# Patient Record
Sex: Female | Born: 1960 | Race: White | Hispanic: No | Smoking: Never smoker
Health system: Southern US, Community
[De-identification: ages and names within clinical notes are randomized; demographics above are authoritative.]

## PROBLEM LIST (undated history)

## (undated) DIAGNOSIS — E785 Hyperlipidemia, unspecified: Secondary | ICD-10-CM

## (undated) DIAGNOSIS — I10 Essential (primary) hypertension: Secondary | ICD-10-CM

## (undated) HISTORY — PX: NO PAST SURGERIES: SHX2092

---

## 2005-07-06 ENCOUNTER — Ambulatory Visit: Payer: Self-pay

## 2006-07-25 ENCOUNTER — Ambulatory Visit: Payer: Self-pay | Admitting: Certified Nurse Midwife

## 2008-01-09 ENCOUNTER — Ambulatory Visit: Payer: Self-pay

## 2008-08-15 ENCOUNTER — Emergency Department: Payer: Self-pay | Admitting: Emergency Medicine

## 2008-08-28 ENCOUNTER — Emergency Department: Payer: Self-pay | Admitting: Emergency Medicine

## 2009-04-15 ENCOUNTER — Ambulatory Visit: Payer: Self-pay

## 2010-06-18 ENCOUNTER — Ambulatory Visit: Payer: Self-pay

## 2010-10-28 ENCOUNTER — Ambulatory Visit: Payer: Self-pay | Admitting: Internal Medicine

## 2011-07-27 ENCOUNTER — Ambulatory Visit: Payer: Self-pay

## 2012-05-07 ENCOUNTER — Emergency Department: Payer: Self-pay | Admitting: Emergency Medicine

## 2012-05-14 ENCOUNTER — Emergency Department: Payer: Self-pay | Admitting: Emergency Medicine

## 2012-07-27 ENCOUNTER — Ambulatory Visit: Payer: Self-pay

## 2013-07-31 ENCOUNTER — Ambulatory Visit: Payer: Self-pay

## 2014-09-12 ENCOUNTER — Ambulatory Visit: Payer: Self-pay

## 2015-07-28 ENCOUNTER — Other Ambulatory Visit: Payer: Self-pay | Admitting: Obstetrics and Gynecology

## 2015-07-28 DIAGNOSIS — Z1231 Encounter for screening mammogram for malignant neoplasm of breast: Secondary | ICD-10-CM

## 2015-09-17 ENCOUNTER — Ambulatory Visit
Admission: RE | Admit: 2015-09-17 | Discharge: 2015-09-17 | Disposition: A | Discharge status: Home / Self Care | Payer: PRIVATE HEALTH INSURANCE | Source: Ambulatory Visit | Attending: Obstetrics and Gynecology | Admitting: Obstetrics and Gynecology

## 2015-09-17 DIAGNOSIS — Z1231 Encounter for screening mammogram for malignant neoplasm of breast: Secondary | ICD-10-CM | POA: Diagnosis not present

## 2016-03-31 ENCOUNTER — Other Ambulatory Visit: Payer: Self-pay | Admitting: Internal Medicine

## 2016-03-31 DIAGNOSIS — E7800 Pure hypercholesterolemia, unspecified: Secondary | ICD-10-CM

## 2016-03-31 DIAGNOSIS — I10 Essential (primary) hypertension: Secondary | ICD-10-CM

## 2016-03-31 DIAGNOSIS — R0789 Other chest pain: Secondary | ICD-10-CM

## 2016-04-08 ENCOUNTER — Ambulatory Visit
Admission: RE | Admit: 2016-04-08 | Discharge: 2016-04-08 | Disposition: A | Discharge status: Home / Self Care | Payer: PRIVATE HEALTH INSURANCE | Source: Ambulatory Visit | Attending: Internal Medicine | Admitting: Internal Medicine

## 2016-04-08 DIAGNOSIS — I10 Essential (primary) hypertension: Secondary | ICD-10-CM | POA: Diagnosis not present

## 2016-04-08 DIAGNOSIS — R0789 Other chest pain: Secondary | ICD-10-CM | POA: Insufficient documentation

## 2016-04-08 DIAGNOSIS — E7800 Pure hypercholesterolemia, unspecified: Secondary | ICD-10-CM | POA: Insufficient documentation

## 2016-04-08 HISTORY — DX: Essential (primary) hypertension: I10

## 2016-04-08 MED ORDER — IOPAMIDOL (ISOVUE-370) INJECTION 76%
75.0000 mL | Freq: Once | INTRAVENOUS | Status: AC | PRN
  Administered 2016-04-08: 75 mL via INTRAVENOUS

## 2016-07-28 ENCOUNTER — Other Ambulatory Visit: Payer: Self-pay | Admitting: Obstetrics and Gynecology

## 2016-07-28 DIAGNOSIS — Z1231 Encounter for screening mammogram for malignant neoplasm of breast: Secondary | ICD-10-CM

## 2016-09-17 ENCOUNTER — Ambulatory Visit
Admission: RE | Admit: 2016-09-17 | Discharge: 2016-09-17 | Disposition: A | Discharge status: Home / Self Care | Payer: BLUE CROSS/BLUE SHIELD | Source: Ambulatory Visit | Attending: Obstetrics and Gynecology | Admitting: Obstetrics and Gynecology

## 2016-09-17 DIAGNOSIS — Z1231 Encounter for screening mammogram for malignant neoplasm of breast: Secondary | ICD-10-CM | POA: Diagnosis present

## 2017-08-03 ENCOUNTER — Other Ambulatory Visit: Payer: Self-pay | Admitting: Obstetrics and Gynecology

## 2017-08-03 DIAGNOSIS — Z1231 Encounter for screening mammogram for malignant neoplasm of breast: Secondary | ICD-10-CM

## 2017-09-21 ENCOUNTER — Ambulatory Visit
Admission: RE | Admit: 2017-09-21 | Discharge: 2017-09-21 | Disposition: A | Discharge status: Home / Self Care | Payer: BLUE CROSS/BLUE SHIELD | Source: Ambulatory Visit | Attending: Obstetrics and Gynecology | Admitting: Obstetrics and Gynecology

## 2017-09-21 DIAGNOSIS — Z1231 Encounter for screening mammogram for malignant neoplasm of breast: Secondary | ICD-10-CM | POA: Diagnosis present

## 2017-11-04 ENCOUNTER — Other Ambulatory Visit: Payer: Self-pay

## 2017-11-04 ENCOUNTER — Inpatient Hospital Stay
Admission: EM | Admit: 2017-11-04 | Discharge: 2017-11-06 | DRG: 603 | Disposition: A | Discharge status: Home / Self Care | Payer: BLUE CROSS/BLUE SHIELD | Attending: Internal Medicine | Admitting: Internal Medicine

## 2017-11-04 ENCOUNTER — Encounter: Payer: Self-pay | Admitting: Emergency Medicine

## 2017-11-04 DIAGNOSIS — Z8042 Family history of malignant neoplasm of prostate: Secondary | ICD-10-CM

## 2017-11-04 DIAGNOSIS — Z79899 Other long term (current) drug therapy: Secondary | ICD-10-CM | POA: Diagnosis not present

## 2017-11-04 DIAGNOSIS — S61251A Open bite of left index finger without damage to nail, initial encounter: Secondary | ICD-10-CM | POA: Diagnosis present

## 2017-11-04 DIAGNOSIS — I10 Essential (primary) hypertension: Secondary | ICD-10-CM | POA: Diagnosis present

## 2017-11-04 DIAGNOSIS — E785 Hyperlipidemia, unspecified: Secondary | ICD-10-CM | POA: Diagnosis present

## 2017-11-04 DIAGNOSIS — E876 Hypokalemia: Secondary | ICD-10-CM | POA: Diagnosis present

## 2017-11-04 DIAGNOSIS — L03012 Cellulitis of left finger: Secondary | ICD-10-CM | POA: Diagnosis present

## 2017-11-04 DIAGNOSIS — W5501XA Bitten by cat, initial encounter: Secondary | ICD-10-CM | POA: Diagnosis not present

## 2017-11-04 DIAGNOSIS — L03114 Cellulitis of left upper limb: Secondary | ICD-10-CM | POA: Diagnosis present

## 2017-11-04 DIAGNOSIS — L0390 Cellulitis, unspecified: Secondary | ICD-10-CM | POA: Diagnosis present

## 2017-11-04 HISTORY — DX: Hyperlipidemia, unspecified: E78.5

## 2017-11-04 LAB — CBC WITH DIFFERENTIAL/PLATELET
Basophils Absolute: 0 10*3/uL (ref 0–0.1)
Basophils Relative: 0 %
EOS ABS: 0 10*3/uL (ref 0–0.7)
Eosinophils Relative: 0 %
HCT: 40.4 % (ref 35.0–47.0)
HEMOGLOBIN: 13.5 g/dL (ref 12.0–16.0)
Lymphocytes Relative: 26 %
Lymphs Abs: 1.9 10*3/uL (ref 1.0–3.6)
MCH: 29.2 pg (ref 26.0–34.0)
MCHC: 33.3 g/dL (ref 32.0–36.0)
MCV: 87.6 fL (ref 80.0–100.0)
Monocytes Absolute: 0.7 10*3/uL (ref 0.2–0.9)
Monocytes Relative: 10 %
NEUTROS PCT: 64 %
Neutro Abs: 4.8 10*3/uL (ref 1.4–6.5)
Platelets: 180 10*3/uL (ref 150–440)
RBC: 4.62 MIL/uL (ref 3.80–5.20)
RDW: 12.9 % (ref 11.5–14.5)
WBC: 7.5 10*3/uL (ref 3.6–11.0)

## 2017-11-04 LAB — COMPREHENSIVE METABOLIC PANEL
ALBUMIN: 4.6 g/dL (ref 3.5–5.0)
ALT: 20 U/L (ref 14–54)
AST: 24 U/L (ref 15–41)
Alkaline Phosphatase: 71 U/L (ref 38–126)
Anion gap: 12 (ref 5–15)
BUN: 10 mg/dL (ref 6–20)
CHLORIDE: 100 mmol/L — AB (ref 101–111)
CO2: 25 mmol/L (ref 22–32)
CREATININE: 0.71 mg/dL (ref 0.44–1.00)
Calcium: 9.3 mg/dL (ref 8.9–10.3)
GFR calc non Af Amer: >60 mL/min (ref >60)
GLUCOSE: 91 mg/dL (ref 65–99)
Potassium: 2.8 mmol/L — ABNORMAL LOW (ref 3.5–5.1)
SODIUM: 137 mmol/L (ref 135–145)
Total Bilirubin: 1.1 mg/dL (ref 0.3–1.2)
Total Protein: 7.9 g/dL (ref 6.5–8.1)

## 2017-11-04 MED ORDER — POTASSIUM CHLORIDE CRYS ER 20 MEQ PO TBCR: 40.0000 meq | EXTENDED_RELEASE_TABLET | Freq: Once | ORAL | Status: DC

## 2017-11-04 MED ORDER — OXYCODONE HCL 5 MG PO TABS: 5.0000 mg | ORAL_TABLET | ORAL | Status: DC | PRN

## 2017-11-04 MED ORDER — VITAMIN B-12 1000 MCG PO TABS
1000.0000 ug | ORAL_TABLET | Freq: Every day | ORAL | Status: DC
  Administered 2017-11-05 – 2017-11-06 (×2): 1000 ug via ORAL
  Filled 2017-11-04 (×2): qty 1

## 2017-11-04 MED ORDER — PIPERACILLIN-TAZOBACTAM 3.375 G IVPB
3.3750 g | Freq: Three times a day (TID) | INTRAVENOUS | Status: DC
  Administered 2017-11-04 – 2017-11-06 (×5): 3.375 g via INTRAVENOUS
  Filled 2017-11-04 (×5): qty 50

## 2017-11-04 MED ORDER — VANCOMYCIN HCL IN DEXTROSE 1-5 GM/200ML-% IV SOLN
INTRAVENOUS | Status: AC
  Administered 2017-11-04: 1000 mg via INTRAVENOUS
  Filled 2017-11-04: qty 200

## 2017-11-04 MED ORDER — PRAVASTATIN SODIUM 20 MG PO TABS
20.0000 mg | ORAL_TABLET | Freq: Every day | ORAL | Status: DC
  Administered 2017-11-05: 20 mg via ORAL
  Filled 2017-11-04: qty 1

## 2017-11-04 MED ORDER — PIPERACILLIN-TAZOBACTAM 3.375 G IVPB
INTRAVENOUS | Status: AC
  Administered 2017-11-04: 3.375 g via INTRAVENOUS
  Filled 2017-11-04: qty 50

## 2017-11-04 MED ORDER — MAGNESIUM SULFATE 2 GM/50ML IV SOLN
2.0000 g | Freq: Once | INTRAVENOUS | Status: AC
Start: 2017-11-04 — End: 2017-11-04
  Administered 2017-11-04: 2 g via INTRAVENOUS
  Filled 2017-11-04: qty 50

## 2017-11-04 MED ORDER — VANCOMYCIN HCL IN DEXTROSE 750-5 MG/150ML-% IV SOLN
750.0000 mg | Freq: Two times a day (BID) | INTRAVENOUS | Status: DC
  Administered 2017-11-05 – 2017-11-06 (×3): 750 mg via INTRAVENOUS
  Filled 2017-11-04 (×5): qty 150

## 2017-11-04 MED ORDER — ACETAMINOPHEN 650 MG RE SUPP
650.0000 mg | Freq: Four times a day (QID) | RECTAL | Status: DC | PRN
  Filled 2017-11-04: qty 1

## 2017-11-04 MED ORDER — PIPERACILLIN-TAZOBACTAM 3.375 G IVPB 30 MIN
3.3750 g | Freq: Once | INTRAVENOUS | Status: AC
  Administered 2017-11-04: 3.375 g via INTRAVENOUS
  Filled 2017-11-04: qty 50

## 2017-11-04 MED ORDER — VANCOMYCIN HCL IN DEXTROSE 1-5 GM/200ML-% IV SOLN
1000.0000 mg | Freq: Once | INTRAVENOUS | Status: AC
  Administered 2017-11-04: 1000 mg via INTRAVENOUS
  Filled 2017-11-04: qty 200

## 2017-11-04 MED ORDER — POTASSIUM CHLORIDE CRYS ER 20 MEQ PO TBCR
40.0000 meq | EXTENDED_RELEASE_TABLET | Freq: Once | ORAL | Status: AC
  Administered 2017-11-04: 40 meq via ORAL
  Filled 2017-11-04: qty 2

## 2017-11-04 MED ORDER — LISINOPRIL 10 MG PO TABS
10.0000 mg | ORAL_TABLET | Freq: Every day | ORAL | Status: DC
  Administered 2017-11-05 – 2017-11-06 (×2): 10 mg via ORAL
  Filled 2017-11-04 (×2): qty 1

## 2017-11-04 MED ORDER — ACETAMINOPHEN 325 MG PO TABS: 650.0000 mg | ORAL_TABLET | Freq: Four times a day (QID) | ORAL | Status: DC | PRN

## 2017-11-04 MED ORDER — CALCIUM CARBONATE-VITAMIN D 500-200 MG-UNIT PO TABS
2.5000 | ORAL_TABLET | Freq: Every day | ORAL | Status: DC
  Administered 2017-11-05 – 2017-11-06 (×2): 2.5 via ORAL
  Filled 2017-11-04: qty 3
  Filled 2017-11-04: qty 2.5
  Filled 2017-11-04: qty 3

## 2017-11-04 NOTE — ED Notes (Signed)
Admission MD at bedside.  

## 2017-11-04 NOTE — Progress Notes (Signed)
Pharmacy Antibiotic Note  Candice FootmanDonna K Radel is a 56 y.o. female admitted on 11/04/2017 with cellulitis/Wound infection.  Pharmacy has been consulted for vancomycin and Zosyn dosing. Patient received Vancomycin 1g IV and Zosyn 3.375 IV x 1 dose in ED.  Patient failed outpatient therapy on Augmentin   Plan: Ke: 0.066   T1/2: 10.5  Vd: 48.6  Will start vancomycin 750mg  IV every 12 hours with 6 hour stack dosing. Calculated trough at Css is 13.6. Trough level ordered prior to 4th dose. WIll monitor renal function and adjust dose as needed.   Start Zosyn 3.375 IV EI every 8 hours.   Height: 5\' 4"  (162.6 cm) Weight: 153 lb (69.4 kg) IBW/kg (Calculated) : 54.7  Temp (24hrs), Avg:98.4 F (36.9 C), Min:98.4 F (36.9 C), Max:98.4 F (36.9 C)  Recent Labs  Lab 11/04/17 1623  WBC 7.5  CREATININE 0.71    Estimated Creatinine Clearance: 74.2 mL/min (by C-G formula based on SCr of 0.71 mg/dL).    No Known Allergies  Antimicrobials this admission: 11/16 Zosyn  >>  11/16 Vancomycin >>   Dose adjustments this admission:  Microbiology results: 11/16  Wound Cx: pending   Thank you for allowing pharmacy to be a part of this patient's care.  Gardner CandleSheema M Wanisha Shiroma, PharmD, BCPS Clinical Pharmacist 11/04/2017 6:52 PM

## 2017-11-04 NOTE — H&P (Signed)
[facility] -  at [facility]   PATIENT NAME: Candice Stewart    MR#:  [id number]  DATE OF BIRTH:  11/03/1961  DATE OF ADMISSION:  11/04/2017  PRIMARY CARE PHYSICIAN: Candice ReichmannHande, Vishwanath, MD   REQUESTING/REFERRING PHYSICIAN: Dr Candice RaddleJohnathan Stewart  CHIEF COMPLAINT:   Chief Complaint  Patient presents with  . Animal Bite    HISTORY OF PRESENT ILLNESS:  Candice Stewart  is a 56 y.o. female was bringing her To the vent after she accidentally hit the cat with a car.  Her cat was upset and bit her on the finger.  The veterinarian cleaned out her finger yesterday and gave her some Augmentin.  Today her finger is red and swollen and started having red streaking up the arm.  Some discomfort in the area but she cannot bend her finger where the bite was.  Hospitalist services were contacted for further evaluation.  PAST MEDICAL HISTORY:   Past Medical History:  Diagnosis Date  . Hyperlipidemia   . Hypertension     PAST SURGICAL HISTORY:   Past Surgical History:  Procedure Laterality Date  . NO PAST SURGERIES      SOCIAL HISTORY:   Social History   Tobacco Use  . Smoking status: Never Smoker  . Smokeless tobacco: Never Used  Substance Use Topics  . Alcohol use: No    Frequency: Never    FAMILY HISTORY:   Family History  Problem Relation Age of Onset  . CVA Mother   . Prostate cancer Father   . Breast cancer Neg Hx     DRUG ALLERGIES:  No Known Allergies  REVIEW OF SYSTEMS:  CONSTITUTIONAL: No fever, fatigue or weakness.  EYES: No blurred or double vision.  EARS, NOSE, AND THROAT: No tinnitus or ear pain. No sore throat RESPIRATORY: No cough, shortness of breath, wheezing or hemoptysis.  CARDIOVASCULAR: No chest pain, orthopnea, edema.  GASTROINTESTINAL: Positive nausea with Augmentin. No vomiting, diarrhea or abdominal pain. No blood in bowel movements GENITOURINARY: No dysuria, hematuria.  ENDOCRINE: No polyuria, nocturia,  HEMATOLOGY:  No anemia, easy bruising or bleeding SKIN: No rash or lesion. MUSCULOSKELETAL: No joint pain or arthritis.   NEUROLOGIC: No tingling, numbness, weakness.  PSYCHIATRY: No anxiety or depression.   MEDICATIONS AT HOME:   Prior to Admission medications   Medication Sig Start Date End Date Taking? Authorizing Provider  amoxicillin-clavulanate (AUGMENTIN) 875-125 MG tablet Take 1 tablet 2 (two) times daily by mouth. 11/03/17 11/12/17 Yes [provider]  Calcium Carbonate-Vitamin D (CALCIUM HIGH POTENCY/VITAMIN D) 600-200 MG-UNIT TABS Take 2.5 tablets daily by mouth.   Yes [provider]  lisinopril-hydrochlorothiazide (PRINZIDE,ZESTORETIC) 10-12.5 MG tablet Take 1 tablet daily by mouth. 09/11/17  Yes [provider]  lovastatin (MEVACOR) 20 MG tablet Take 40 mg daily by mouth. 09/11/17  Yes [provider]  valACYclovir (VALTREX) 500 MG tablet Take 500 mg 2 (two) times daily as needed by mouth. 07/28/17  Yes [provider]  vitamin B-12 (CYANOCOBALAMIN) 1000 MCG tablet Take 1,000 mcg daily by mouth.   Yes [provider]      VITAL SIGNS:  Blood pressure 131/86, pulse 89, temperature 98.4 F (36.9 C), temperature source Oral, resp. rate 14, height 5\' 4"  (1.626 m), weight 69.4 kg (153 lb), SpO2 97 %.  PHYSICAL EXAMINATION:  GENERAL:  56 y.o.-year-old patient lying in the bed with no acute distress.  EYES: Pupils equal, round, reactive to light and accommodation. No scleral icterus. Extraocular muscles intact.  HEENT: Head atraumatic, normocephalic. Oropharynx and nasopharynx clear.  NECK:  Supple, no jugular venous distention. No thyroid enlargement, no tenderness.  LUNGS: Normal breath sounds bilaterally, no wheezing, rales,rhonchi or crepitation. No use of accessory muscles of respiration.  CARDIOVASCULAR: S1, S2 normal. No murmurs, rubs, or gallops.  ABDOMEN: Soft, nontender, nondistended. Bowel sounds present. No organomegaly or mass.   EXTREMITIES: No pedal edema, cyanosis, or clubbing.  NEUROLOGIC: Cranial nerves II through XII are intact. Muscle strength 5/5 in all extremities. Sensation intact. Gait not checked.  PSYCHIATRIC: The patient is alert and oriented x 3.  SKIN: Left hand index finger bite between the PIP and DIP joint.  Surrounding erythema on the finger up into the hand and streaking up the arm.  The streaking has faded a little bit since IV antibiotics were given in the ER.  Patient unable to bend her finger completely.  LABORATORY PANEL:   CBC Recent Labs  Lab 11/04/17 1623  WBC 7.5  HGB 13.5  HCT 40.4  PLT 180   ------------------------------------------------------------------------------------------------------------------  Chemistries  Recent Labs  Lab 11/04/17 1623  NA 137  K 2.8*  CL 100*  CO2 25  GLUCOSE 91  BUN 10  CREATININE 0.71  CALCIUM 9.3  AST 24  ALT 20  ALKPHOS 71  BILITOT 1.1   ------------------------------------------------------------------------------------------------------------------   IMPRESSION AND PLAN:   1.  Cat bite cellulitis that has worsened quickly.  IV antibiotics with Zosyn and vancomycin.  Culture sent by ER physician.  Case discussed with orthopedic Dr. Joice LoftsPoggi who would like to evaluate the finger prior to admission. 2.  Hypokalemia.  Replace magnesium and potassium.  Likely cause of this is hydrochlorothiazide.  Discontinue the hydrochlorothiazide out of her combination pill. 3.  Essential hypertension continue lisinopril only 4.  Hyperlipidemia unspecified on statin.  All the records are reviewed and case discussed with ED provider. Management plans discussed with the patient, family and they are in agreement.  CODE STATUS: Full code  TOTAL TIME TAKING CARE OF THIS PATIENT: 50 minutes.    Candice Stewart M.D on 11/04/2017 at 6:26 PM  Between 7am to 6pm - Pager - [phone]  After 6pm call admission pager [phone]  [facility]  [phone]  CC: Primary care physician; Candice ReichmannHande, Vishwanath, MD

## 2017-11-04 NOTE — ED Triage Notes (Signed)
Says bit by cat yesterday.  Today has red area on left hand.  Says it is her cat and is up to date on shots.  Sent here by kc for iv antibiotics.

## 2017-11-04 NOTE — Consult Note (Signed)
ORTHOPAEDIC CONSULTATION  REQUESTING PHYSICIAN: Alford HighlandWieting, Richard, MD  Chief Complaint:   Left index finger pain and swelling.  History of Present Illness: Candice FootmanDonna K Curtice is a 56 y.o. female with a history of hypertension hyperlipidemia who was bitten by her cat 2 days ago after she accidentally struck the cat with her car.  She went to the veterinarian yesterday who washed out her finger and gave her a prescription for some Augmentin.  Over the past 12 hours or so, she has noted increased pain and swelling in the finger.  [facility] and was sent over to the emergency room for admission and IV antibiotic treatment.  The patient denies any fevers denies any numbness or paresthesias to her fingertip.  She is able to flex and extend the index finger, although motion is somewhat limited due to her swelling.  Past Medical History:  Diagnosis Date  . Hyperlipidemia   . Hypertension    Past Surgical History:  Procedure Laterality Date  . NO PAST SURGERIES     Social History   Socioeconomic History  . Marital status: Widowed    Spouse name: None  . Number of children: None  . Years of education: None  . Highest education level: None  Social Needs  . Financial resource strain: None  . Food insecurity - worry: None  . Food insecurity - inability: None  . Transportation needs - medical: None  . Transportation needs - non-medical: None  Occupational History  . None  Tobacco Use  . Smoking status: Never Smoker  . Smokeless tobacco: Never Used  Substance and Sexual Activity  . Alcohol use: No    Frequency: Never  . Drug use: None  . Sexual activity: None  Other Topics Concern  . None  Social History Narrative  . None   Family History  Problem Relation Age of Onset  . CVA Mother   . Prostate cancer Father   . Breast cancer Neg Hx    No Known Allergies Prior to Admission medications    Medication Sig Start Date End Date Taking? Authorizing Provider  amoxicillin-clavulanate (AUGMENTIN) 875-125 MG tablet Take 1 tablet 2 (two) times daily by mouth. 11/03/17 11/12/17 Yes [provider]  Calcium Carbonate-Vitamin D (CALCIUM HIGH POTENCY/VITAMIN D) 600-200 MG-UNIT TABS Take 2.5 tablets daily by mouth.   Yes [provider]  lisinopril-hydrochlorothiazide (PRINZIDE,ZESTORETIC) 10-12.5 MG tablet Take 1 tablet daily by mouth. 09/11/17  Yes [provider]  lovastatin (MEVACOR) 20 MG tablet Take 40 mg daily by mouth. 09/11/17  Yes [provider]  valACYclovir (VALTREX) 500 MG tablet Take 500 mg 2 (two) times daily as needed by mouth. 07/28/17  Yes [provider]  vitamin B-12 (CYANOCOBALAMIN) 1000 MCG tablet Take 1,000 mcg daily by mouth.   Yes [provider]   No results found.  Positive ROS: All other systems have been reviewed and were otherwise negative with the exception of those mentioned in the HPI and as above.  Physical Exam: General:  Alert, no acute distress Psychiatric:  Patient is competent for consent with normal mood and affect   Cardiovascular:  No pedal edema Respiratory:  No wheezing, non-labored breathing GI:  Abdomen is soft and non-tender Skin:  No lesions in the area of chief complaint Neurologic:  Sensation intact distally Lymphatic:  No axillary or cervical lymphadenopathy  Orthopedic Exam:  Orthopedic examination is limited to the left hand.  The left index finger demonstrates moderate swelling and erythema  radial half of the next finger centered over the middle phalanx, but extending to the DIP and PIP joints.  She has 2 punctate sites along the radial aspect of the middle phalanx, one dorsal and one volar.  A trace amount of purulent material can be expressed from the more dorsal bite.  She has no significant tenderness to palpation along the flexor sheath and is able to flex and extend both the PIP and  DIP joints, although motion is somewhat limited due to her swelling.  She has neurovascularly intact to her fingertip.  X-rays:  None  Assessment: Left index finger cellulitis secondary to cat bite.  Plan: I agree with the proposed plan for admitting this patient for close observation and IV antibiotics.  At this point, she does not appear to have a flexor tenosynovitis, but we would need to watch the finger carefully in case this develops.  Thank you for asked me to participate in the care of this most pleasant woman.  I will be happy to follow her with you.   Maryagnes AmosJ. Jeffrey Joshia Kitchings, MD  Beeper #:  [phone]  11/04/2017 6:51 PM

## 2017-11-04 NOTE — ED Notes (Signed)
Hospitalist to bedside at this time 

## 2017-11-04 NOTE — ED Provider Notes (Signed)
[facility] Emergency Department Provider Note       Time seen: ----------------------------------------- 4:32 PM on 11/04/2017 -----------------------------------------    I have reviewed the triage vital signs and the nursing notes.  HISTORY   Chief Complaint Animal Bite    HPI Candice FootmanDonna K Stewart is a 56 y.o. female with a history of hypertension who presents to the ED for a cat bite and possible infection in her left index finger.  She was bitten by her cat yesterday, today has a red area on the left hand.  She reports the shots for the cat or up-to-date.  She has started on Augmentin yesterday but notes the hand was not as swollen as it is today.  She is not having much pain but has had marketed redness and swelling of the left index finger and left hand.  Past Medical History:  Diagnosis Date  . Hypertension     There are no active problems to display for this patient.   History reviewed. No pertinent surgical history.  Allergies Patient has no known allergies.  Social History Social History   Tobacco Use  . Smoking status: Never Smoker  . Smokeless tobacco: Never Used  Substance Use Topics  . Alcohol use: No    Frequency: Never  . Drug use: Not on file    Review of Systems Constitutional: Negative for fever. Cardiovascular: Negative for chest pain. Respiratory: Negative for shortness of breath. Gastrointestinal: Negative for abdominal pain, vomiting and diarrhea. Genitourinary: Negative for dysuria. Musculoskeletal: Positive for left index finger pain Skin: Positive for left hand erythema Neurological: Negative for headaches, focal weakness or numbness.  All systems negative/normal/unremarkable except as stated in the HPI  ____________________________________________   PHYSICAL EXAM:  VITAL SIGNS: ED Triage Vitals  Enc Vitals Group     BP 11/04/17 1554 131/86     Pulse Rate 11/04/17 1554 89     Resp 11/04/17 1554 14   Temp 11/04/17 1554 98.4 F (36.9 C)     Temp Source 11/04/17 1554 Oral     SpO2 11/04/17 1554 97 %     Weight 11/04/17 1556 153 lb (69.4 kg)     Height 11/04/17 1556 5\' 4"  (1.626 m)     Head Circumference --      Peak Flow --      Pain Score --      Pain Loc --      Pain Edu? --      Excl. in GC? --    Constitutional: Alert and oriented. Well appearing and in no distress. Eyes: Conjunctivae are normal. Normal extraocular movements. Cardiovascular: Normal rate, regular rhythm. No murmurs, rubs, or gallops. Respiratory: Normal respiratory effort without tachypnea nor retractions. Breath sounds are clear and equal bilaterally. No wheezes/rales/rhonchi. Musculoskeletal: Some tenderness and purulent drainage is noted from the left index finger.  Mild pain with range of motion of the left index finger Neurologic:  Normal speech and language. No gross focal neurologic deficits are appreciated.  Skin: 2 puncture wounds are noted over the distal aspect of the left index finger.  There is diffuse mild swelling of the left index finger and erythema that extends into the hand dorsally.  Some erythema is noted in the wrist Psychiatric: Mood and affect are normal. Speech and behavior are normal.  ____________________________________________  ED COURSE:  Pertinent labs & imaging results that were available during my care of the patient were reviewed by me and considered in my medical decision making (see  chart for details). Patient presents for a cat bite, we will assess with labs as indicated.  She will require IV antibiotics.   Procedures ____________________________________________   LABS (pertinent positives/negatives)  Labs Reviewed  COMPREHENSIVE METABOLIC PANEL  CBC WITH DIFFERENTIAL/PLATELET   ____________________________________________  DIFFERENTIAL DIAGNOSIS   Cat bite, cellulitis, tenosynovitis, sepsis  FINAL ASSESSMENT AND PLAN  Cellulitis, cat bite   Plan: Patient had  presented for a cat bite and cellulitis that appears to be worsening. Patient's labs are reassuring but the degree of progressive swelling and erythema is concerning.  She does have some purulent drainage which we have sent for wound culture.  I have ordered IV Vanco and Zosyn and will discuss with the hospitalist for observation.   Emily FilbertWilliams, Monroe Qin E, MD   Note: This note was generated in part or whole with voice recognition software. Voice recognition is usually quite accurate but there are transcription errors that can and very often do occur. I apologize for any typographical errors that were not detected and corrected.     Emily FilbertWilliams, Teri Legacy E, MD 11/04/17 [phone]

## 2017-11-04 NOTE — ED Notes (Signed)
Attempted to call report, RN unavailable at this time.  Name and ascom number left to receive call back. 

## 2017-11-05 LAB — CBC
HEMATOCRIT: 36 % (ref 35.0–47.0)
HEMOGLOBIN: 12 g/dL (ref 12.0–16.0)
MCH: 29.2 pg (ref 26.0–34.0)
MCHC: 33.4 g/dL (ref 32.0–36.0)
MCV: 87.4 fL (ref 80.0–100.0)
Platelets: 169 10*3/uL (ref 150–440)
RBC: 4.12 MIL/uL (ref 3.80–5.20)
RDW: 12.9 % (ref 11.5–14.5)
WBC: 5.1 10*3/uL (ref 3.6–11.0)

## 2017-11-05 LAB — BASIC METABOLIC PANEL
Anion gap: 8 (ref 5–15)
BUN: 11 mg/dL (ref 6–20)
CO2: 26 mmol/L (ref 22–32)
CREATININE: 0.75 mg/dL (ref 0.44–1.00)
Calcium: 8.6 mg/dL — ABNORMAL LOW (ref 8.9–10.3)
Chloride: 104 mmol/L (ref 101–111)
GFR calc Af Amer: >60 mL/min (ref >60)
GLUCOSE: 108 mg/dL — AB (ref 65–99)
Potassium: 3.9 mmol/L (ref 3.5–5.1)
Sodium: 138 mmol/L (ref 135–145)

## 2017-11-05 LAB — MAGNESIUM: MAGNESIUM: 2.2 mg/dL (ref 1.7–2.4)

## 2017-11-05 NOTE — Progress Notes (Signed)
[employer] - [place] at [facility]                                                                                                                                                                                  Patient Demographics   Candice Stewart, is a 56 y.o. female, DOB - 1961/11/15, HYQ:[id number]  Admit date - 11/04/2017   Admitting Physician Alford Highland, MD  Outpatient Primary MD for the patient is Barbette Reichmann, MD   LOS - 1  Subjective: Patient states that the swelling of the arm is improved left hand index finger able to move more    Review of Systems:   CONSTITUTIONAL: No documented fever. No fatigue, weakness. No weight gain, no weight loss.  EYES: No blurry or double vision.  ENT: No tinnitus. No postnasal drip. No redness of the oropharynx.  RESPIRATORY: No cough, no wheeze, no hemoptysis. No dyspnea.  CARDIOVASCULAR: No chest pain. No orthopnea. No palpitations. No syncope.  GASTROINTESTINAL: No nausea, no vomiting or diarrhea. No abdominal pain. No melena or hematochezia.  GENITOURINARY: No dysuria or hematuria.  ENDOCRINE: No polyuria or nocturia. No heat or cold intolerance.  HEMATOLOGY: No anemia. No bruising. No bleeding.  INTEGUMENTARY: No rashes. No lesions.  MUSCULOSKELETAL: No arthritis. No swelling. No gout.  Left hand index finger swollen NEUROLOGIC: No numbness, tingling, or ataxia. No seizure-type activity.  PSYCHIATRIC: No anxiety. No insomnia. No ADD.    Vitals:   Vitals:   11/04/17 1556 11/04/17 2000 11/05/17 0505 11/05/17 0931  BP:  109/70 101/64 119/73  Pulse:  84 74 88  Resp:  18 18 16   Temp:  97.7 F (36.5 C) 98.5 F (36.9 C)   TempSrc:  Oral Oral   SpO2:  100% 99% 98%  Weight: 153 lb (69.4 kg)     Height: 5\' 4"  (1.626 m)       Wt Readings from Last 3 Encounters:  11/04/17 153 lb (69.4 kg)     Intake/Output Summary (Last 24 hours) at 11/05/2017 1313 Last data filed at 11/05/2017 0910 Gross per 24 hour   Intake 450 ml  Output 1 ml  Net 449 ml    Physical Exam:   GENERAL: Pleasant-appearing in no apparent distress.  HEAD, EYES, EARS, NOSE AND THROAT: Atraumatic, normocephalic. Extraocular muscles are intact. Pupils equal and reactive to light. Sclerae anicteric. No conjunctival injection. No oro-pharyngeal erythema.  NECK: Supple. There is no jugular venous distention. No bruits, no lymphadenopathy, no thyromegaly.  HEART: Regular rate and rhythm,. No murmurs, no rubs, no clicks.  LUNGS: Clear to auscultation bilaterally. No rales or rhonchi. No wheezes.  ABDOMEN: Soft, flat, nontender, nondistended. Has good  bowel sounds. No hepatosplenomegaly appreciated.  EXTREMITIES: No evidence of any cyanosis, clubbing, or peripheral edema.  +2 pedal and radial pulses bilaterally.  Left hand index finger erythema and swelling NEUROLOGIC: The patient is alert, awake, and oriented x3 with no focal motor or sensory deficits appreciated bilaterally.  SKIN: Moist and warm with no rashes appreciated.  Psych: Not anxious, depressed LN: No inguinal LN enlargement    Antibiotics   Anti-infectives (From admission, onward)   Start     Dose/Rate Route Frequency Ordered Stop   11/05/17 0000  vancomycin (VANCOCIN) IVPB 750 mg/150 ml premix     750 mg 150 mL/hr over 60 Minutes Intravenous Every 12 hours 11/04/17 1848     11/04/17 2200  piperacillin-tazobactam (ZOSYN) IVPB 3.375 g     3.375 g 12.5 mL/hr over 240 Minutes Intravenous Every 8 hours 11/04/17 1848     11/04/17 1700  vancomycin (VANCOCIN) IVPB 1000 mg/200 mL premix     1,000 mg 200 mL/hr over 60 Minutes Intravenous  Once 11/04/17 1655 11/04/17 1817   11/04/17 1700  piperacillin-tazobactam (ZOSYN) IVPB 3.375 g     3.375 g 100 mL/hr over 30 Minutes Intravenous  Once 11/04/17 1655 11/04/17 1752      Medications   Scheduled Meds: . calcium-vitamin D  2.5 tablet Oral Daily  . lisinopril  10 mg Oral Daily  . potassium chloride  40 mEq Oral  Once  . pravastatin  20 mg Oral q1800  . vitamin B-12  1,000 mcg Oral Daily   Continuous Infusions: . piperacillin-tazobactam (ZOSYN)  IV Stopped (11/05/17 0910)  . vancomycin Stopped (11/05/17 0114)   PRN Meds:.acetaminophen **OR** acetaminophen, oxyCODONE   Data Review:   Micro Results Recent Results (from the past 240 hour(s))  Aerobic/Anaerobic Culture (surgical/deep wound)     Status: None (Preliminary result)   Collection Time: 11/04/17  5:11 PM  Result Value Ref Range Status   Specimen Description WOUND  Final   Special Requests Normal  Final   Gram Stain   Final    RARE WBC PRESENT, PREDOMINANTLY PMN NO ORGANISMS SEEN Performed at [facility], [address] [place], [place] [address]    Culture PENDING  Incomplete   Report Status PENDING  Incomplete    Radiology Reports No results found.   CBC Recent Labs  Lab 11/04/17 1623 11/05/17 0323  WBC 7.5 5.1  HGB 13.5 12.0  HCT 40.4 36.0  PLT 180 169  MCV 87.6 87.4  MCH 29.2 29.2  MCHC 33.3 33.4  RDW 12.9 12.9  LYMPHSABS 1.9  --   MONOABS 0.7  --   EOSABS 0.0  --   BASOSABS 0.0  --     Chemistries  Recent Labs  Lab 11/04/17 1623 11/05/17 0323  NA 137 138  K 2.8* 3.9  CL 100* 104  CO2 25 26  GLUCOSE 91 108*  BUN 10 11  CREATININE 0.71 0.75  CALCIUM 9.3 8.6*  MG  --  2.2  AST 24  --   ALT 20  --   ALKPHOS 71  --   BILITOT 1.1  --    ------------------------------------------------------------------------------------------------------------------ estimated creatinine clearance is 74.2 mL/min (by C-G formula based on SCr of 0.75 mg/dL). ------------------------------------------------------------------------------------------------------------------ No results for input(s): HGBA1C in the last 72 hours. ------------------------------------------------------------------------------------------------------------------ No results for input(s): CHOL, HDL, LDLCALC, TRIG, CHOLHDL, LDLDIRECT in  the last 72 hours. ------------------------------------------------------------------------------------------------------------------ No results for input(s): TSH, T4TOTAL, T3FREE, THYROIDAB in the last 72 hours.  Invalid input(s):  FREET3 ------------------------------------------------------------------------------------------------------------------ No results for input(s): VITAMINB12, FOLATE, FERRITIN, TIBC, IRON, RETICCTPCT in the last 72 hours.  Coagulation profile No results for input(s): INR, PROTIME in the last 168 hours.  No results for input(s): DDIMER in the last 72 hours.  Cardiac Enzymes No results for input(s): CKMB, TROPONINI, MYOGLOBIN in the last 168 hours.  Invalid input(s): CK ------------------------------------------------------------------------------------------------------------------ Invalid input(s): POCBNP    Assessment & Plan   1.  Cat bite cellulitis continue IV antibiotics.  Can change to oral tomorrow 2.  Hypokalemia.    Replaced 3.  Essential hypertension continue lisinopril  4.  Hyperlipidemia unspecified on statin.       Code Status Orders  (From admission, onward)        Start     Ordered   11/04/17 1820  Full code  Continuous     11/04/17 1819    Code Status History    Date Active Date Inactive Code Status Order ID Comments User Context   This patient has a current code status but no historical code status.           Consults orthopedics  DVT Prophylaxis ambulatory Lab Results  Component Value Date   PLT 169 11/05/2017     Time Spent in minutes   35min Greater than 50% of time spent in care coordination and counseling patient regarding the condition and plan of care.   Auburn BilberryPATEL, Maricruz Lucero M.D on 11/05/2017 at 1:13 PM  Between 7am to 6pm - Pager - [phone]  After 6pm go to [email or web address] - password [email or web address] [facility]  [facility]   Office  [phone]

## 2017-11-05 NOTE — Plan of Care (Signed)
  Progressing Clinical Measurements: Ability to avoid or minimize complications of infection will improve 11/05/2017 1626 - Progressing by Garwin Brothershomas, Preslyn Warr Lynn, RN Note Redness and edema decreasing on pt's left arm, hand and index finger Skin Integrity: Skin integrity will improve 11/05/2017 1626 - Progressing by Garwin Brothershomas, Sanaia Jasso Lynn, RN Education: Knowledge of General Education information will improve 11/05/2017 1626 - Progressing by Garwin Brothershomas, Nathanal Hermiz Lynn, RN Health Behavior/Discharge Planning: Ability to manage health-related needs will improve 11/05/2017 1626 - Progressing by Garwin Brothershomas, Emiah Pellicano Lynn, RN Clinical Measurements: Ability to maintain clinical measurements within normal limits will improve 11/05/2017 1626 - Progressing by Garwin Brothershomas, Lexus Shampine Lynn, RN Will remain free from infection 11/05/2017 1626 - Progressing by Garwin Brothershomas, Damante Spragg Lynn, RN Diagnostic test results will improve 11/05/2017 1626 - Progressing by Garwin Brothershomas, Mansfield Dann Lynn, RN Respiratory complications will improve 11/05/2017 1626 - Progressing by Garwin Brothershomas, Susano Cleckler Lynn, RN Cardiovascular complication will be avoided 11/05/2017 1626 - Progressing by Garwin Brothershomas, Abdelaziz Westenberger Lynn, RN Activity: Risk for activity intolerance will decrease 11/05/2017 1626 - Progressing by Garwin Brothershomas, Elpidia Karn Lynn, RN Nutrition: Adequate nutrition will be maintained 11/05/2017 1626 - Progressing by Garwin Brothershomas, Caley Ciaramitaro Lynn, RN Coping: Level of anxiety will decrease 11/05/2017 1626 - Progressing by Garwin Brothershomas, Khira Cudmore Lynn, RN Elimination: Will not experience complications related to bowel motility 11/05/2017 1626 - Progressing by Garwin Brothershomas, Johneisha Broaden Lynn, RN Will not experience complications related to urinary retention 11/05/2017 1626 - Progressing by Garwin Brothershomas, Deshanta Lady Lynn, RN Pain Managment: General experience of comfort will improve 11/05/2017 1626 - Progressing by Garwin Brothershomas, Hollis Tuller Lynn, RN Safety: Ability to remain free from injury will improve 11/05/2017 1626 - Progressing by Garwin Brothershomas,  Elizah Lydon Lynn, RN Note Pt remains on Low Fall Risks Skin Integrity: Risk for impaired skin integrity will decrease 11/05/2017 1626 - Progressing by Garwin Brothershomas, Karema Tocci Lynn, RN

## 2017-11-05 NOTE — Progress Notes (Signed)
Pt remaining alert and oriented. No complaints of pain to the left hand. Left hand still swollen and red. Tolerating iv antibiotics without difficulty.

## 2017-11-05 NOTE — Progress Notes (Signed)
Subjective: The patient already notes improvement in her symptoms since last night.  She feels that the swelling and erythema have improved.  She denies any new complaints.   Objective: Vital signs in last 24 hours: Temp:  [97.7 F (36.5 C)-98.5 F (36.9 C)] 98.5 F (36.9 C) (11/17 0505) Pulse Rate:  [74-89] 74 (11/17 0505) Resp:  [14-18] 18 (11/17 0505) BP: (101-131)/(64-86) 101/64 (11/17 0505) SpO2:  [97 %-100 %] 99 % (11/17 0505) Weight:  [69.4 kg (153 lb)] 69.4 kg (153 lb) (11/16 1556)  Intake/Output from previous day: 11/16 0701 - 11/17 0700 In: 400 [IV Piggyback:400] Out: 1 [Urine:1] Intake/Output this shift: No intake/output data recorded.  Recent Labs    11/04/17 1623 11/05/17 0323  HGB 13.5 12.0   Recent Labs    11/04/17 1623 11/05/17 0323  WBC 7.5 5.1  RBC 4.62 4.12  HCT 40.4 36.0  PLT 180 169   Recent Labs    11/04/17 1623 11/05/17 0323  NA 137 138  K 2.8* 3.9  CL 100* 104  CO2 25 26  BUN 10 11  CREATININE 0.71 0.75  GLUCOSE 91 108*  CALCIUM 9.3 8.6*   No results for input(s): LABPT, INR in the last 72 hours.  Physical Exam: Examination of the left index finger and upper extremity demonstrates the erythema is much improved.  The areas of erythema extending up into her forearm have resolved.  She still has some erythema involving the index finger centered over the bite sites but not extending beyond the mid-portion of the proximal phalanx.  Moderate swelling of the finger persists, but she is able to flex and extend her finger freely and without pain, although she is still stiff due to the swelling.  She remains neurovascular intact to all digits.  Assessment: Cellulitis of left upper extremity secondary to cat bite to left index finger, improving symptomatically.  Plan: The patient already appears to be responding to the IV antibiotics.  I feel that it would be reasonable and appropriate to continue IV antibiotics for another 24 hours before  switching her over to p.o. antibiotics.   Excell SeltzerJohn J Jonessa Triplett 11/05/2017, 8:17 AM

## 2017-11-06 ENCOUNTER — Other Ambulatory Visit: Payer: Self-pay

## 2017-11-06 LAB — CBC
HCT: 36.4 % (ref 35.0–47.0)
Hemoglobin: 12.1 g/dL (ref 12.0–16.0)
MCH: 29.2 pg (ref 26.0–34.0)
MCHC: 33.2 g/dL (ref 32.0–36.0)
MCV: 87.9 fL (ref 80.0–100.0)
PLATELETS: 170 10*3/uL (ref 150–440)
RBC: 4.15 MIL/uL (ref 3.80–5.20)
RDW: 13 % (ref 11.5–14.5)
WBC: 4.9 10*3/uL (ref 3.6–11.0)

## 2017-11-06 LAB — BASIC METABOLIC PANEL
ANION GAP: 7 (ref 5–15)
BUN: 14 mg/dL (ref 6–20)
CHLORIDE: 106 mmol/L (ref 101–111)
CO2: 25 mmol/L (ref 22–32)
Calcium: 8.9 mg/dL (ref 8.9–10.3)
Creatinine, Ser: 0.82 mg/dL (ref 0.44–1.00)
GFR calc Af Amer: >60 mL/min (ref >60)
GLUCOSE: 95 mg/dL (ref 65–99)
POTASSIUM: 4 mmol/L (ref 3.5–5.1)
Sodium: 138 mmol/L (ref 135–145)

## 2017-11-06 LAB — HIV ANTIBODY (ROUTINE TESTING W REFLEX): HIV Screen 4th Generation wRfx: NONREACTIVE

## 2017-11-06 NOTE — Progress Notes (Signed)
Subjective: Patient feeling much better today.  Notes that most of the stiffness and soreness in her finger has resolved.  Objective: Vital signs in last 24 hours: Temp:  [98.5 F (36.9 C)-99 F (37.2 C)] 98.5 F (36.9 C) (11/18 0952) Pulse Rate:  [73-86] 86 (11/18 0952) Resp:  [14] 14 (11/18 0952) BP: (109-126)/(68-86) 126/86 (11/18 0952) SpO2:  [97 %-100 %] 100 % (11/18 0952)  Intake/Output from previous day: 11/17 0701 - 11/18 0700 In: 708.5 [P.O.:470; IV Piggyback:238.5] Out: -  Intake/Output this shift: Total I/O In: 340 [P.O.:240; IV Piggyback:100] Out: -   Recent Labs    11/04/17 1623 11/05/17 0323 11/06/17 0330  HGB 13.5 12.0 12.1   Recent Labs    11/05/17 0323 11/06/17 0330  WBC 5.1 4.9  RBC 4.12 4.15  HCT 36.0 36.4  PLT 169 170   Recent Labs    11/05/17 0323 11/06/17 0330  NA 138 138  K 3.9 4.0  CL 104 106  CO2 26 25  BUN 11 14  CREATININE 0.75 0.82  GLUCOSE 108* 95  CALCIUM 8.6* 8.9   No results for input(s): LABPT, INR in the last 72 hours.  Physical Exam: There is only mild residual erythema and swelling around the 2 bite sites in the midportion of her left index middle phalanx.  All of the other swelling and erythema has completely resolved.  She exhibits near full range of motion of the index finger actively and passively.  She is neurovascularly intact to the fingertip.  Assessment: Left index finger cellulitis status post cat bite, nearly resolved symptomatically.  Plan: I feel that the patient can be discharged home today with p.o. antibiotics.  She may progress in her activities as symptoms permit.  She will follow-up with me in 1 week if necessary.  Thank you for asking me to participate in the care of this most pleasant woman.   Excell SeltzerJohn J Klara Stjames 11/06/2017, 12:21 PM

## 2017-11-06 NOTE — Discharge Summary (Signed)
[employer] [facility] - [place] at [facility]  Candice Stewart, 56 y.o., DOB 10-09-1961, MRN [id number]. Admission date: 11/04/2017 Discharge Date 11/06/2017 Primary MD Barbette ReichmannHande, Vishwanath, MD Admitting Physician Alford Highlandichard Wieting, MD  Admission Diagnosis  Cellulitis of finger of left hand [L03.012] Cat bite, initial encounter [W55.01XA]  Discharge Diagnosis   Active Problems: Left hand index finger cellulitis due to cat bite Hypokalemia Essential hypertension Hyperlipidemia      Hospital Course Candice Stewart  is a 56 y.o. female was bringing her To the vent after she accidentally hit the cat with a car.  Her cat was upset and bit her on the finger.  The veterinarian cleaned out her finger  and gave her some Augmentin.  On day of her admission her finger is red and swollen and started having red streaking up the arm.  Due to this patient was admitted to the hospital.  She was started on IV antibiotics and was seen by orthopedic surgery.  They did not recommend that she needs surgery at this point.  Swelling of the hand is much improved.  Patient will be now transition back to oral antibiotics.              Consults  orthopedic surgery  Significant Tests:  See full reports for all details     No results found.     Today   Subjective:   Candice Stewart patient's finger is much better swelling much less Objective:   Blood pressure 126/86, pulse 86, temperature 98.5 F (36.9 C), temperature source Oral, resp. rate 14, height 5\' 4"  (1.626 m), weight 153 lb (69.4 kg), SpO2 100 %.  .  Intake/Output Summary (Last 24 hours) at 11/06/2017 1603 Last data filed at 11/06/2017 1037 Gross per 24 hour  Intake 620.05 ml  Output -  Net 620.05 ml    Exam VITAL SIGNS: Blood pressure 126/86, pulse 86, temperature 98.5 F (36.9 C), temperature source Oral, resp. rate 14, height 5\' 4"  (1.626 m), weight 153 lb (69.4 kg), SpO2 100 %.  GENERAL:  56 y.o.-year-old patient lying in the bed  with no acute distress.  EYES: Pupils equal, round, reactive to light and accommodation. No scleral icterus. Extraocular muscles intact.  HEENT: Head atraumatic, normocephalic. Oropharynx and nasopharynx clear.  NECK:  Supple, no jugular venous distention. No thyroid enlargement, no tenderness.  LUNGS: Normal breath sounds bilaterally, no wheezing, rales,rhonchi or crepitation. No use of accessory muscles of respiration.  CARDIOVASCULAR: S1, S2 normal. No murmurs, rubs, or gallops.  ABDOMEN: Soft, nontender, nondistended. Bowel sounds present. No organomegaly or mass.  EXTREMITIES: Left index finger with mild erythema but significant improvement NEUROLOGIC: Cranial nerves II through XII are intact. Muscle strength 5/5 in all extremities. Sensation intact. Gait not checked.  PSYCHIATRIC: The patient is alert and oriented x 3.  SKIN: No obvious rash, lesion, or ulcer.   Data Review     CBC w Diff:  Lab Results  Component Value Date   WBC 4.9 11/06/2017   HGB 12.1 11/06/2017   HCT 36.4 11/06/2017   PLT 170 11/06/2017   LYMPHOPCT 26 11/04/2017   MONOPCT 10 11/04/2017   EOSPCT 0 11/04/2017   BASOPCT 0 11/04/2017   CMP:  Lab Results  Component Value Date   NA 138 11/06/2017   K 4.0 11/06/2017   CL 106 11/06/2017   CO2 25 11/06/2017   BUN 14 11/06/2017   CREATININE 0.82 11/06/2017   PROT 7.9 11/04/2017   ALBUMIN 4.6 11/04/2017  BILITOT 1.1 11/04/2017   ALKPHOS 71 11/04/2017   AST 24 11/04/2017   ALT 20 11/04/2017  .  Micro Results Recent Results (from the past 240 hour(s))  Aerobic/Anaerobic Culture (surgical/deep wound)     Status: None (Preliminary result)   Collection Time: 11/04/17  5:11 PM  Result Value Ref Range Status   Specimen Description WOUND  Final   Special Requests Normal  Final   Gram Stain   Final    RARE WBC PRESENT, PREDOMINANTLY PMN NO ORGANISMS SEEN    Culture   Final    CULTURE REINCUBATED FOR BETTER GROWTH Performed at [facility],  [address] [place], [place] [address]    Report Status PENDING  Incomplete     Code Status History    Date Active Date Inactive Code Status Order ID Comments User Context   11/04/2017 18:19 11/06/2017 16:00 Full Code [id number]  Alford HighlandWieting, Richard, MD ED          Follow-up Information    Barbette ReichmannHande, Vishwanath, MD Follow up.   Specialty:  Internal Medicine Contact information: [address] [facility] [place] [address] [phone]           Discharge Medications   Allergies as of 11/06/2017   No Known Allergies     Medication List    TAKE these medications   amoxicillin-clavulanate 875-125 MG tablet Commonly known as:  AUGMENTIN Take 1 tablet 2 (two) times daily by mouth.   CALCIUM HIGH POTENCY/VITAMIN D 600-200 MG-UNIT Tabs Generic drug:  Calcium Carbonate-Vitamin D Take 2.5 tablets daily by mouth.   lisinopril-hydrochlorothiazide 10-12.5 MG tablet Commonly known as:  PRINZIDE,ZESTORETIC Take 1 tablet daily by mouth.   lovastatin 20 MG tablet Commonly known as:  MEVACOR Take 40 mg daily by mouth.   valACYclovir 500 MG tablet Commonly known as:  VALTREX Take 500 mg 2 (two) times daily as needed by mouth.   vitamin B-12 1000 MCG tablet Commonly known as:  CYANOCOBALAMIN Take 1,000 mcg daily by mouth.          Total Time in preparing paper work, data evaluation and todays exam - 35 minutes  Auburn BilberryPATEL, Nishika Parkhurst M.D on 11/06/2017 at 4:03 [facility]  [facility]   Office  [phone]

## 2017-11-06 NOTE — Progress Notes (Signed)
Patient is to be discharged home today. Patient is in no acute distress at this time, and assessment is unchanged from this morning. Patient's IV is out, discharge paperwork has been discussed with patient/family and there are no questions or concerns at this time. Patient will be accompanied downstairs by staff and family via wheelchair.   

## 2017-11-06 NOTE — Discharge Instructions (Signed)
[employer] - [place] at [facility] ° °DIET:  °Cardiac diet ° °DISCHARGE CONDITION:  °Stable ° °ACTIVITY:  °Activity as tolerated ° °OXYGEN:  °Home Oxygen: No. °  °Oxygen Delivery: room air ° °DISCHARGE LOCATION:  °home  ° ° °ADDITIONAL DISCHARGE INSTRUCTION: ° ° °If you experience worsening of your admission symptoms, develop shortness of breath, life threatening emergency, suicidal or homicidal thoughts you must seek medical attention immediately by calling 911 or calling your MD immediately  if symptoms less severe. ° °You Must read complete instructions/literature along with all the possible adverse reactions/side effects for all the Medicines you take and that have been prescribed to you. Take any new Medicines after you have completely understood and accpet all the possible adverse reactions/side effects.  ° °Please note ° °You were cared for by a hospitalist during your hospital stay. If you have any questions about your discharge medications or the care you received while you were in the hospital after you are discharged, you can call the unit and asked to speak with the hospitalist on call if the hospitalist that took care of you is not available. Once you are discharged, your primary care physician will handle any further medical issues. Please note that NO REFILLS for any discharge medications will be authorized once you are discharged, as it is imperative that you return to your primary care physician (or establish a relationship with a primary care physician if you do not have one) for your aftercare needs so that they can reassess your need for medications and monitor your lab values. ° ° °

## 2017-11-06 NOTE — Progress Notes (Signed)
Pt redness and swelling decreasing. No complaints of pain this shift. Up to bathroom independently. Pt able to sleep in between care.

## 2017-11-06 NOTE — Progress Notes (Signed)
Per Dr. Allena KatzPatel 1130 vancomycin trough can be discontinued and 1200 dose of vancomycin can be held and pt may be discharged on PO antibiotics.

## 2017-11-10 LAB — AEROBIC/ANAEROBIC CULTURE (SURGICAL/DEEP WOUND)

## 2017-11-10 LAB — AEROBIC/ANAEROBIC CULTURE W GRAM STAIN (SURGICAL/DEEP WOUND): Special Requests: NORMAL

## 2018-08-09 ENCOUNTER — Other Ambulatory Visit: Payer: Self-pay | Admitting: Obstetrics and Gynecology

## 2018-08-09 DIAGNOSIS — Z1231 Encounter for screening mammogram for malignant neoplasm of breast: Secondary | ICD-10-CM

## 2018-09-25 ENCOUNTER — Ambulatory Visit
Admission: RE | Admit: 2018-09-25 | Discharge: 2018-09-25 | Disposition: A | Discharge status: Home / Self Care | Payer: BLUE CROSS/BLUE SHIELD | Source: Ambulatory Visit | Attending: Obstetrics and Gynecology | Admitting: Obstetrics and Gynecology

## 2018-09-25 DIAGNOSIS — Z1231 Encounter for screening mammogram for malignant neoplasm of breast: Secondary | ICD-10-CM | POA: Insufficient documentation

## 2019-08-06 ENCOUNTER — Other Ambulatory Visit: Payer: Self-pay | Admitting: Internal Medicine

## 2019-08-06 DIAGNOSIS — Z1231 Encounter for screening mammogram for malignant neoplasm of breast: Secondary | ICD-10-CM

## 2019-09-03 ENCOUNTER — Other Ambulatory Visit: Payer: Self-pay | Admitting: Obstetrics and Gynecology

## 2019-09-03 DIAGNOSIS — Z1231 Encounter for screening mammogram for malignant neoplasm of breast: Secondary | ICD-10-CM

## 2019-10-09 ENCOUNTER — Ambulatory Visit
Admission: RE | Admit: 2019-10-09 | Discharge: 2019-10-09 | Disposition: A | Discharge status: Home / Self Care | Payer: PRIVATE HEALTH INSURANCE | Source: Ambulatory Visit | Attending: Obstetrics and Gynecology | Admitting: Obstetrics and Gynecology

## 2019-10-09 DIAGNOSIS — Z1231 Encounter for screening mammogram for malignant neoplasm of breast: Secondary | ICD-10-CM | POA: Insufficient documentation

## 2020-09-29 ENCOUNTER — Other Ambulatory Visit: Payer: Self-pay | Admitting: Internal Medicine

## 2020-09-29 DIAGNOSIS — Z1231 Encounter for screening mammogram for malignant neoplasm of breast: Secondary | ICD-10-CM

## 2020-10-16 ENCOUNTER — Ambulatory Visit
Admission: RE | Admit: 2020-10-16 | Discharge: 2020-10-16 | Disposition: A | Discharge status: Home / Self Care | Payer: 59 | Source: Ambulatory Visit | Attending: Internal Medicine | Admitting: Internal Medicine

## 2020-10-16 ENCOUNTER — Other Ambulatory Visit: Payer: Self-pay

## 2020-10-16 DIAGNOSIS — Z1231 Encounter for screening mammogram for malignant neoplasm of breast: Secondary | ICD-10-CM | POA: Insufficient documentation

## 2021-09-28 ENCOUNTER — Other Ambulatory Visit: Payer: Self-pay | Admitting: Internal Medicine

## 2021-09-28 DIAGNOSIS — Z1231 Encounter for screening mammogram for malignant neoplasm of breast: Secondary | ICD-10-CM

## 2021-11-11 ENCOUNTER — Other Ambulatory Visit: Payer: Self-pay

## 2021-11-11 ENCOUNTER — Ambulatory Visit
Admission: RE | Admit: 2021-11-11 | Discharge: 2021-11-11 | Disposition: A | Discharge status: Home / Self Care | Payer: 59 | Source: Ambulatory Visit | Attending: Internal Medicine | Admitting: Internal Medicine

## 2021-11-11 DIAGNOSIS — Z1231 Encounter for screening mammogram for malignant neoplasm of breast: Secondary | ICD-10-CM | POA: Diagnosis present

## 2021-11-18 ENCOUNTER — Other Ambulatory Visit: Payer: Self-pay | Admitting: Internal Medicine

## 2021-11-18 DIAGNOSIS — R928 Other abnormal and inconclusive findings on diagnostic imaging of breast: Secondary | ICD-10-CM

## 2021-11-18 DIAGNOSIS — N6489 Other specified disorders of breast: Secondary | ICD-10-CM

## 2021-11-27 ENCOUNTER — Other Ambulatory Visit: Payer: Self-pay

## 2021-11-27 ENCOUNTER — Ambulatory Visit
Admission: RE | Admit: 2021-11-27 | Discharge: 2021-11-27 | Disposition: A | Discharge status: Home / Self Care | Payer: 59 | Source: Ambulatory Visit | Attending: Internal Medicine | Admitting: Internal Medicine

## 2021-11-27 DIAGNOSIS — R928 Other abnormal and inconclusive findings on diagnostic imaging of breast: Secondary | ICD-10-CM

## 2021-11-27 DIAGNOSIS — N6489 Other specified disorders of breast: Secondary | ICD-10-CM | POA: Diagnosis present

## 2021-12-23 DIAGNOSIS — Z03818 Encounter for observation for suspected exposure to other biological agents ruled out: Secondary | ICD-10-CM | POA: Diagnosis not present

## 2022-02-08 IMAGING — MG MM DIGITAL DIAGNOSTIC UNILAT*L* W/ TOMO W/ CAD
6 series · 6 of 18 positions shown · non-contrast
Comparison: Previous exam(s).

CLINICAL DATA: Patient recalled from screening for left breast
asymmetry.

EXAM:
DIGITAL DIAGNOSTIC UNILATERAL LEFT MAMMOGRAM WITH TOMOSYNTHESIS AND
CAD
TECHNIQUE: Left digital diagnostic mammography and breast tomosynthesis was
performed. The images were evaluated with computer-aided detection.

[L CC synth-2D]
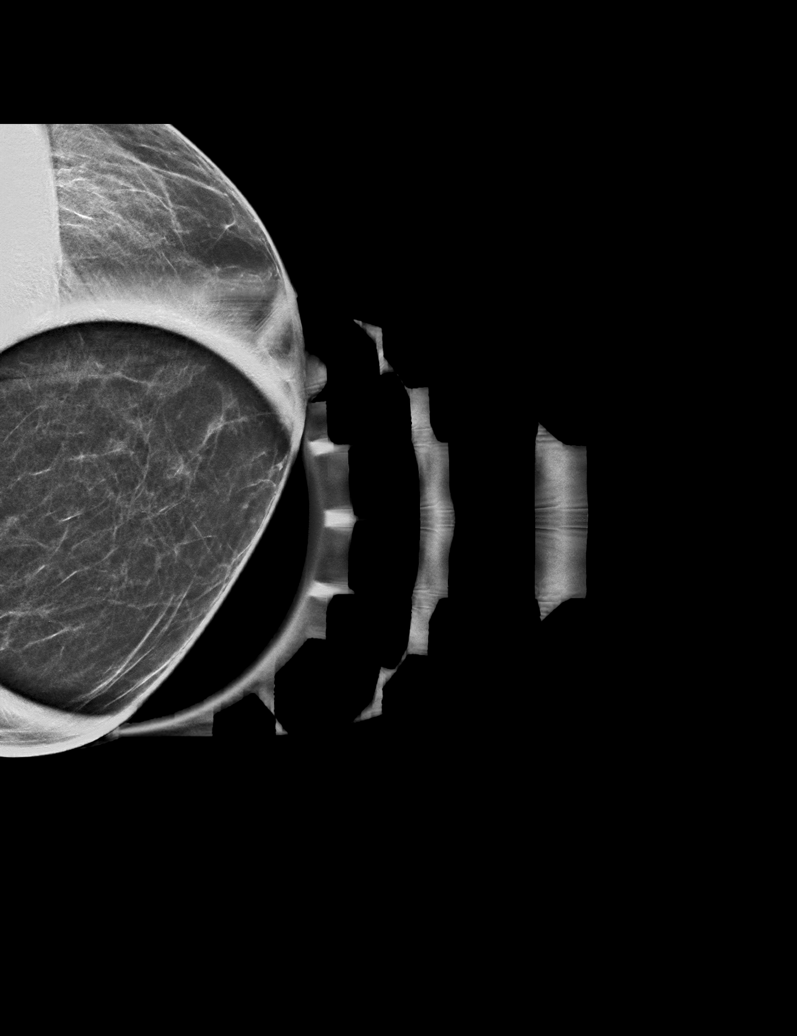

[L ML synth-2D (1 of 2)]
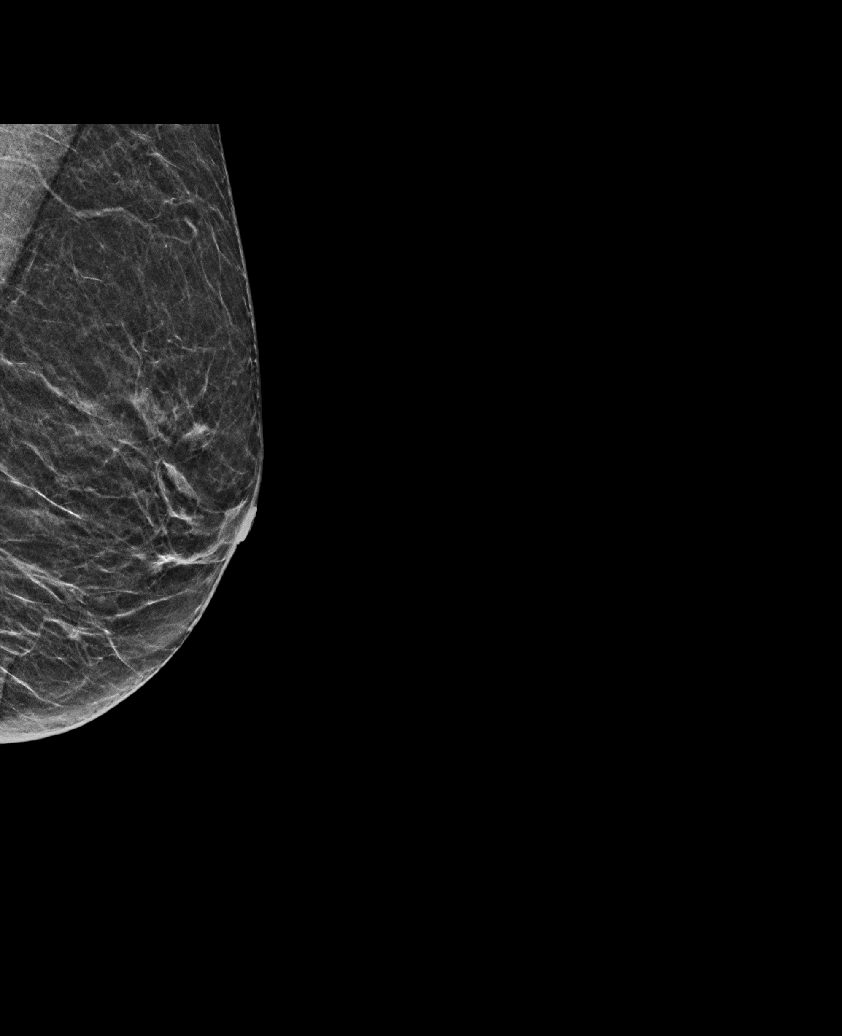

[L ML synth-2D (2 of 2)]
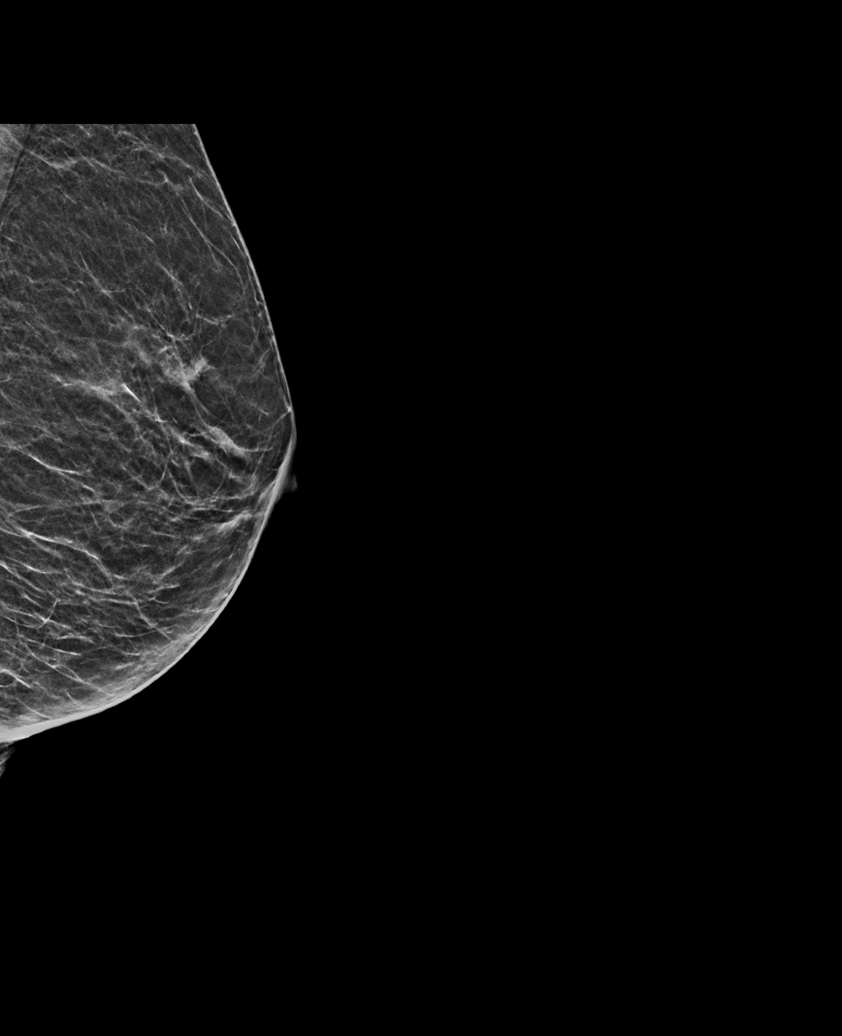

[L ML tomo (1 of 2) · tomo slice 21/41.0]
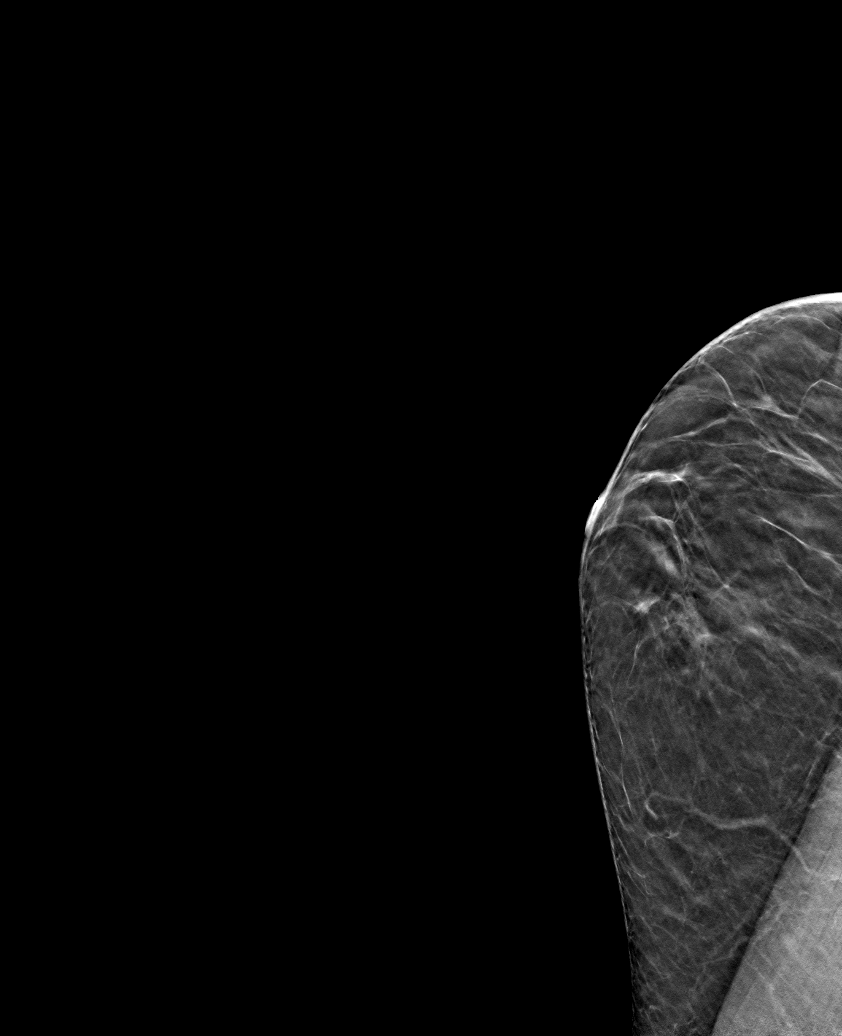

[L ML tomo (2 of 2) · tomo slice 20/39.0]
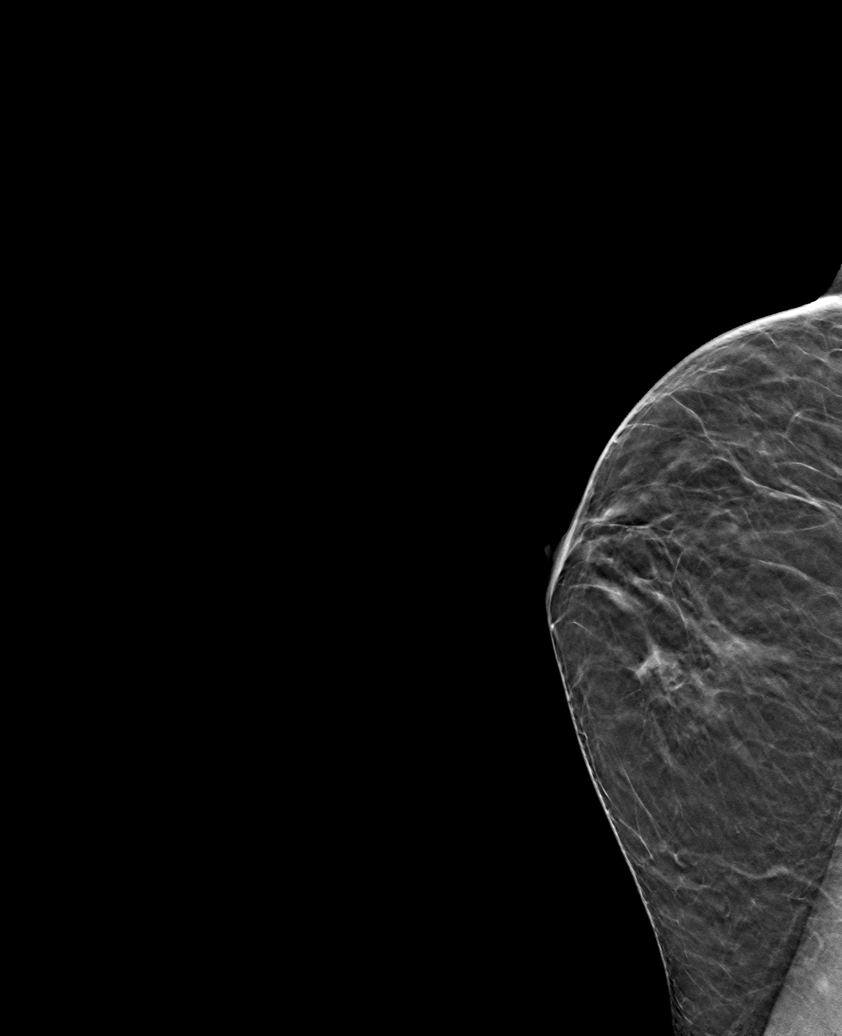

[L CC tomo · tomo slice 20/39.0]
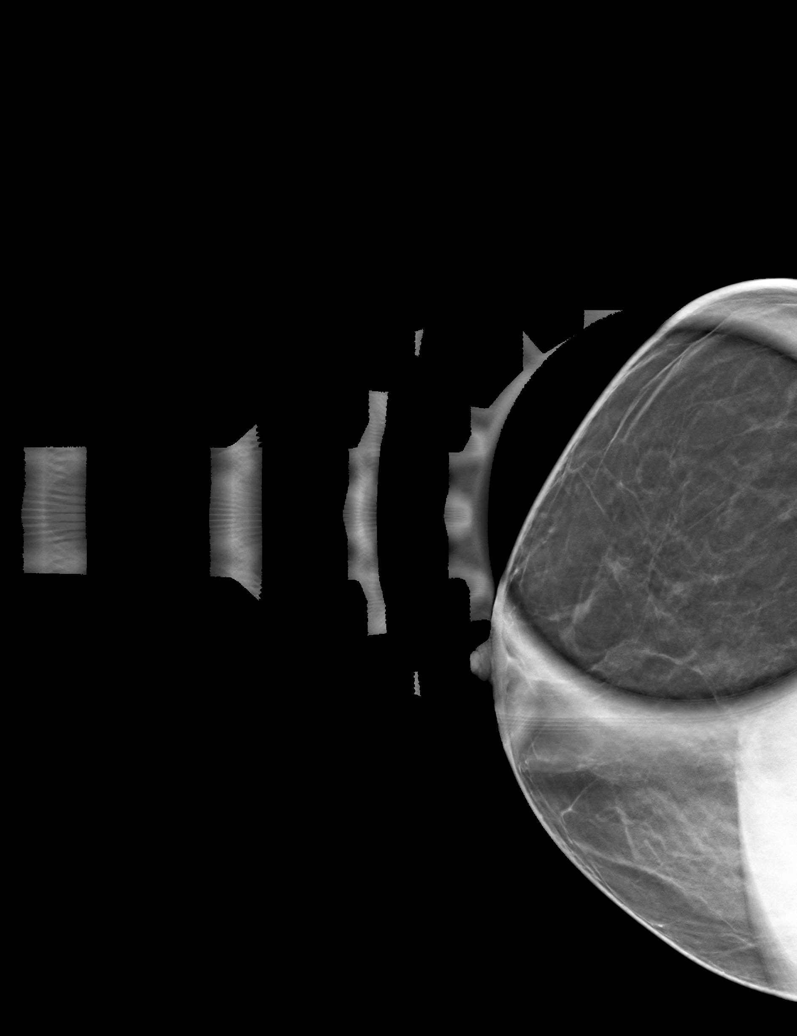

[6 of 18 positions shown; findings below may reference images not displayed]

ACR Breast Density Category b: There are scattered areas of
fibroglandular density.
FINDINGS: Questioned asymmetry within the left breast resolved with additional
imaging compatible with dense fibroglandular tissue. No suspicious
findings.
IMPRESSION: No mammographic evidence for malignancy.

RECOMMENDATION:
Screening mammogram in one year.(Code:[id number])

I have discussed the findings and recommendations with the patient.
If applicable, a reminder letter will be sent to the patient
regarding the next appointment.

BI-RADS CATEGORY  1: Negative.

## 2022-02-09 DIAGNOSIS — I10 Essential (primary) hypertension: Secondary | ICD-10-CM | POA: Diagnosis not present

## 2022-02-09 DIAGNOSIS — M8589 Other specified disorders of bone density and structure, multiple sites: Secondary | ICD-10-CM | POA: Diagnosis not present

## 2022-02-09 DIAGNOSIS — G4763 Sleep related bruxism: Secondary | ICD-10-CM | POA: Diagnosis not present

## 2022-02-09 DIAGNOSIS — E7800 Pure hypercholesterolemia, unspecified: Secondary | ICD-10-CM | POA: Diagnosis not present

## 2022-02-19 DIAGNOSIS — G4763 Sleep related bruxism: Secondary | ICD-10-CM | POA: Diagnosis not present

## 2022-02-19 DIAGNOSIS — E7800 Pure hypercholesterolemia, unspecified: Secondary | ICD-10-CM | POA: Diagnosis not present

## 2022-02-19 DIAGNOSIS — I10 Essential (primary) hypertension: Secondary | ICD-10-CM | POA: Diagnosis not present

## 2022-02-19 DIAGNOSIS — Z1331 Encounter for screening for depression: Secondary | ICD-10-CM | POA: Diagnosis not present

## 2022-02-19 DIAGNOSIS — Z0000 Encounter for general adult medical examination without abnormal findings: Secondary | ICD-10-CM | POA: Diagnosis not present

## 2022-05-02 ENCOUNTER — Other Ambulatory Visit: Payer: Self-pay

## 2022-05-02 ENCOUNTER — Encounter: Payer: Self-pay | Admitting: Emergency Medicine

## 2022-05-02 ENCOUNTER — Emergency Department
Admission: EM | Admit: 2022-05-02 | Discharge: 2022-05-02 | Disposition: A | Discharge status: Home / Self Care | Payer: 59 | Attending: Emergency Medicine | Admitting: Emergency Medicine

## 2022-05-02 DIAGNOSIS — S61234A Puncture wound without foreign body of right ring finger without damage to nail, initial encounter: Secondary | ICD-10-CM | POA: Diagnosis not present

## 2022-05-02 DIAGNOSIS — Z203 Contact with and (suspected) exposure to rabies: Secondary | ICD-10-CM | POA: Insufficient documentation

## 2022-05-02 DIAGNOSIS — S61451A Open bite of right hand, initial encounter: Secondary | ICD-10-CM | POA: Diagnosis not present

## 2022-05-02 DIAGNOSIS — Z2913 Encounter for prophylactic Rho(D) immune globulin: Secondary | ICD-10-CM | POA: Insufficient documentation

## 2022-05-02 DIAGNOSIS — S71151A Open bite, right thigh, initial encounter: Secondary | ICD-10-CM | POA: Diagnosis not present

## 2022-05-02 DIAGNOSIS — Z23 Encounter for immunization: Secondary | ICD-10-CM | POA: Insufficient documentation

## 2022-05-02 DIAGNOSIS — W5501XA Bitten by cat, initial encounter: Secondary | ICD-10-CM | POA: Diagnosis not present

## 2022-05-02 DIAGNOSIS — S65301A Unspecified injury of deep palmar arch of right hand, initial encounter: Secondary | ICD-10-CM | POA: Diagnosis not present

## 2022-05-02 MED ORDER — AMOXICILLIN-POT CLAVULANATE 875-125 MG PO TABS: 1.0000 | ORAL_TABLET | Freq: Two times a day (BID) | ORAL | 0 refills | Status: AC

## 2022-05-02 MED ORDER — TETANUS-DIPHTH-ACELL PERTUSSIS 5-2.5-18.5 LF-MCG/0.5 IM SUSY
0.5000 mL | PREFILLED_SYRINGE | Freq: Once | INTRAMUSCULAR | Status: AC
Start: 2022-05-02 — End: 2022-05-02
  Administered 2022-05-02: 0.5 mL via INTRAMUSCULAR
  Filled 2022-05-02: qty 0.5

## 2022-05-02 MED ORDER — RABIES IMMUNE GLOBULIN 1500 UNIT/10ML IJ SOLN
20.0000 [IU]/kg | Freq: Once | INTRAMUSCULAR | Status: AC
  Administered 2022-05-02: 1095 [IU] via INTRAMUSCULAR
  Filled 2022-05-02 (×3): qty 10

## 2022-05-02 MED ORDER — RABIES IMMUNE GLOBULIN 1500 UNIT/10ML IJ SOLN
20.0000 [IU]/kg | Freq: Once | INTRAMUSCULAR | Status: DC
  Filled 2022-05-02: qty 10

## 2022-05-02 MED ORDER — AMOXICILLIN-POT CLAVULANATE 875-125 MG PO TABS
1.0000 | ORAL_TABLET | Freq: Once | ORAL | Status: AC
Start: 2022-05-02 — End: 2022-05-02
  Administered 2022-05-02: 1 via ORAL
  Filled 2022-05-02: qty 1

## 2022-05-02 MED ORDER — RABIES VACCINE, PCEC IM SUSR
1.0000 mL | Freq: Once | INTRAMUSCULAR | Status: AC
Start: 2022-05-02 — End: 2022-05-02
  Administered 2022-05-02: 1 mL via INTRAMUSCULAR
  Filled 2022-05-02: qty 1

## 2022-05-02 NOTE — ED Notes (Signed)
[employer] called to given report to Animal control. Patient's information given to dispatch.  ?

## 2022-05-02 NOTE — ED Notes (Signed)
The pt was given her discharge instructions. The pt is still talking with PD in room which may further delay discharge.  ?

## 2022-05-02 NOTE — ED Triage Notes (Signed)
Pt reports last pm she was messing with a cat that came in her yard and it scratched her. Pt states this am her right hand was red and swollen around the knuckles. Pt reports this same things has happened previously and she had to get abx.  ?

## 2022-05-02 NOTE — ED Provider Notes (Signed)
? ?[facility] ?Provider Note ? ? ? Event Date/Time  ? First MD Initiated Contact with Patient 05/02/22 [phone]   ?  (approximate) ? ? ?History  ? ?Animal Bite ? ? ?HPI ? ?Candice Stewart is a 61 y.o. female   presents to the ED with complaint of possible cat bite/scratch that occurred last evening.  Patient states that she has been feeding a feral cat and last evening it came to eat.  A neighbor's cat was also on the porch with it and they began fighting.  Patient reached down to hold the 2 cats apart when the feral cat bit or scratched her.  She states that she washed it out extremely well last evening but woke up this morning with her hand being red.  She is unsure of her last tetanus.  Patient has a history of a previous cat bite in 2018 which she needed to be hospitalized for 3 days due to an infection.  The neighbor's cat is up-to-date on immunizations but she has not been able to get the feral cat into a cage to take to the vet or clinic.  Animal control has not been called but is going to be called while in the ED. ? ?  ? ? ?Physical Exam  ? ?Triage Vital Signs: ?ED Triage Vitals  ?Enc Vitals Group  ?   BP 05/02/22 0854 (!) 117/91  ?   Pulse Rate 05/02/22 0854 (!) 111  ?   Resp 05/02/22 0854 18  ?   Temp 05/02/22 0854 98.6 ?F (37 ?C)  ?   Temp Source 05/02/22 0854 Oral  ?   SpO2 05/02/22 0854 100 %  ?   Weight 05/02/22 0853 154 lb 5.2 oz (70 kg)  ?   Height 05/02/22 0853 5\' 4"  (1.626 m)  ?   Head Circumference --   ?   Peak Flow --   ?   Pain Score 05/02/22 0853 0  ?   Pain Loc --   ?   Pain Edu? --   ?   Excl. in GC? --   ? ? ?Most recent vital signs: ?Vitals:  ? 05/02/22 0854  ?BP: (!) 117/91  ?Pulse: (!) 111  ?Resp: 18  ?Temp: 98.6 ?F (37 ?C)  ?SpO2: 100%  ? ? ? ?General: Awake, no distress.  ?CV:  Good peripheral perfusion.  ?Resp:  Normal effort.  ?Abd:  No distention.  ?Other:  On examination of the right hand dorsal aspect there is a single puncture wound noted over the fourth MP joint  area with erythema approximately 1-1/2 cm above this area.  Patient is able to flex and extend the digit without any difficulty.  No drainage is noted.  Capillary refills less than 3 seconds and motor sensory function intact. ? ? ?ED Results / Procedures / Treatments  ? ?Labs ?(all labs ordered are listed, but only abnormal results are displayed) ?Labs Reviewed - No data to display ? ? ? ? ?PROCEDURES: ? ?Critical Care performed:  ? ?Procedures ? ? ?MEDICATIONS ORDERED IN ED: ?Medications  ?Tdap (BOOSTRIX) injection 0.5 mL (0.5 mLs Intramuscular Given 05/02/22 1014)  ?amoxicillin-clavulanate (AUGMENTIN) 875-125 MG per tablet 1 tablet (1 tablet Oral Given 05/02/22 1112)  ?rabies vaccine (RABAVERT) injection 1 mL (1 mL Intramuscular Given 05/02/22 1110)  ?Rabies Immune Globulin SOLN 1,095 Units (1,095 Units Intramuscular Given 05/02/22 1250)  ? ? ? ?IMPRESSION / MDM / ASSESSMENT AND PLAN / ED COURSE  ?I reviewed  the triage vital signs and the nursing notes. ? ? ?Differential diagnosis includes, but is not limited to, cat bite right hand, cat scratch right hand, cellulitis right hand. ? ?61 year old female presents to the ED after a feral cat either bit or scratched her on her right hand.  Patient is to call animal control when she gets home to see if they can catch the cat.  Patient is sure that the cat that belongs to the neighbor has been completely immunized against rabies so the only question is the feral cat.  Because patient has a $200 co-pay when she comes to the emergency department and discussion about possibility of feral cat and not being able to get it to be observed or tested patient decided to get the rabies prophylaxis.  This was given to her while in the ED and she is aware that she can get the remaining injections at [facility] urgent care.  There location and phone number was given to her on her discharge papers.  She was started on Augmentin 875 while in the ED and states she does not need pain  medication.  A prescription for Augmentin to continue for the next 10 days was sent to her pharmacy.  She is given strict return precautions that if the infection continues, pus, fever, chills or increased pain in her hand she is to return to the emergency department immediately which she knows will be for hospitalization. ? ? ? ? ?FINAL CLINICAL IMPRESSION(S) / ED DIAGNOSES  ? ?Final diagnoses:  ?Cat bite, initial encounter  ? ? ? ?Rx / DC Orders  ? ?ED Discharge Orders   ? ?      Ordered  ?  amoxicillin-clavulanate (AUGMENTIN) 875-125 MG tablet  2 times daily       ? 05/02/22 1052  ? ?  ?  ? ?  ? ? ? ?Note:  This document was prepared using Dragon voice recognition software and may include unintentional dictation errors. ?  ?Tommi Rumps, PA-C ?05/02/22 1321 ? ?  ?Gilles Chiquito, MD ?05/02/22 1539 ? ?

## 2022-05-02 NOTE — Discharge Instructions (Addendum)
clean daily with mild soap and water and watch for any worsening.  Return to the emergency department if any worsening, extension of the redness, fever, chills or inability to move your fourth or fifth finger at which time is likely you will be hospitalized. ?5/17 ?5/21 ?5/28 ? ?

## 2022-05-05 ENCOUNTER — Ambulatory Visit
Admission: RE | Admit: 2022-05-05 | Discharge: 2022-05-05 | Disposition: A | Discharge status: Home / Self Care | Payer: 59 | Source: Ambulatory Visit | Attending: Emergency Medicine | Admitting: Emergency Medicine

## 2022-05-05 DIAGNOSIS — Z203 Contact with and (suspected) exposure to rabies: Secondary | ICD-10-CM | POA: Diagnosis not present

## 2022-05-05 MED ORDER — RABIES VACCINE, PCEC IM SUSR
1.0000 mL | Freq: Once | INTRAMUSCULAR | Status: AC
  Administered 2022-05-05: 1 mL via INTRAMUSCULAR

## 2022-05-09 ENCOUNTER — Ambulatory Visit
Admission: RE | Admit: 2022-05-09 | Discharge: 2022-05-09 | Disposition: A | Discharge status: Home / Self Care | Payer: 59 | Source: Ambulatory Visit | Attending: Emergency Medicine | Admitting: Emergency Medicine

## 2022-05-09 DIAGNOSIS — Z203 Contact with and (suspected) exposure to rabies: Secondary | ICD-10-CM | POA: Diagnosis not present

## 2022-05-09 MED ORDER — RABIES VACCINE, PCEC IM SUSR
1.0000 mL | Freq: Once | INTRAMUSCULAR | Status: AC
  Administered 2022-05-09: 1 mL via INTRAMUSCULAR

## 2022-05-09 NOTE — ED Triage Notes (Signed)
Patient presents to Urgent Care for rabies vaccine day 7. Tolerated previous vaccines well. Instructed to return for day 14 vaccine. Voiced understanding.

## 2022-05-13 DIAGNOSIS — L298 Other pruritus: Secondary | ICD-10-CM | POA: Diagnosis not present

## 2022-05-13 DIAGNOSIS — G4763 Sleep related bruxism: Secondary | ICD-10-CM | POA: Diagnosis not present

## 2022-05-13 DIAGNOSIS — I10 Essential (primary) hypertension: Secondary | ICD-10-CM | POA: Diagnosis not present

## 2022-05-16 ENCOUNTER — Ambulatory Visit
Admission: RE | Admit: 2022-05-16 | Discharge: 2022-05-16 | Disposition: A | Discharge status: Home / Self Care | Payer: 59 | Source: Ambulatory Visit | Attending: Family Medicine | Admitting: Family Medicine

## 2022-05-16 VITALS — BP 119/78 | HR 72 | Temp 98.0°F | Resp 16

## 2022-05-16 DIAGNOSIS — Z23 Encounter for immunization: Secondary | ICD-10-CM

## 2022-05-16 DIAGNOSIS — Z203 Contact with and (suspected) exposure to rabies: Secondary | ICD-10-CM

## 2022-05-16 MED ORDER — RABIES VACCINE, PCEC IM SUSR
1.0000 mL | Freq: Once | INTRAMUSCULAR | Status: AC
  Administered 2022-05-16: 1 mL via INTRAMUSCULAR

## 2022-05-16 NOTE — ED Triage Notes (Signed)
Pt presents for day 3 rabies vaccine

## 2022-05-16 NOTE — ED Triage Notes (Signed)
Pt presents for 3rd rabies vaccine.  

## 2022-05-16 NOTE — ED Provider Notes (Signed)
Nurse Visit only 3rd Rabies Vaccine    Bing Neighbors, FNP 05/16/22 [phone]

## 2022-05-26 DIAGNOSIS — R8290 Unspecified abnormal findings in urine: Secondary | ICD-10-CM | POA: Diagnosis not present

## 2022-05-26 DIAGNOSIS — R399 Unspecified symptoms and signs involving the genitourinary system: Secondary | ICD-10-CM | POA: Diagnosis not present

## 2022-10-21 ENCOUNTER — Other Ambulatory Visit: Payer: Self-pay | Admitting: Internal Medicine

## 2022-10-21 DIAGNOSIS — Z1231 Encounter for screening mammogram for malignant neoplasm of breast: Secondary | ICD-10-CM

## 2022-11-25 ENCOUNTER — Ambulatory Visit
Admission: RE | Admit: 2022-11-25 | Discharge: 2022-11-25 | Disposition: A | Discharge status: Home / Self Care | Payer: 59 | Source: Ambulatory Visit | Attending: Internal Medicine | Admitting: Internal Medicine

## 2022-11-25 DIAGNOSIS — Z1231 Encounter for screening mammogram for malignant neoplasm of breast: Secondary | ICD-10-CM | POA: Diagnosis present

## 2023-10-21 ENCOUNTER — Other Ambulatory Visit: Payer: Self-pay | Admitting: Internal Medicine

## 2023-10-21 DIAGNOSIS — Z1231 Encounter for screening mammogram for malignant neoplasm of breast: Secondary | ICD-10-CM

## 2023-11-28 ENCOUNTER — Ambulatory Visit
Admission: RE | Admit: 2023-11-28 | Discharge: 2023-11-28 | Disposition: A | Discharge status: Home / Self Care | Payer: 59 | Source: Ambulatory Visit | Attending: Internal Medicine | Admitting: Internal Medicine

## 2023-11-28 DIAGNOSIS — Z1231 Encounter for screening mammogram for malignant neoplasm of breast: Secondary | ICD-10-CM | POA: Insufficient documentation

## 2024-10-29 ENCOUNTER — Other Ambulatory Visit: Payer: Self-pay | Admitting: Internal Medicine

## 2024-10-29 DIAGNOSIS — Z1231 Encounter for screening mammogram for malignant neoplasm of breast: Secondary | ICD-10-CM

## 2024-12-03 ENCOUNTER — Ambulatory Visit
Admission: RE | Admit: 2024-12-03 | Discharge: 2024-12-03 | Disposition: A | Source: Ambulatory Visit | Attending: Internal Medicine | Admitting: Internal Medicine

## 2024-12-03 DIAGNOSIS — Z1231 Encounter for screening mammogram for malignant neoplasm of breast: Secondary | ICD-10-CM | POA: Insufficient documentation
